# Patient Record
Sex: Female | Born: 2000 | Race: White | Hispanic: No | Marital: Single | Smoking: Current some day smoker
Health system: Southern US, Community
[De-identification: ages and names within clinical notes are randomized; demographics above are authoritative.]

## PROBLEM LIST (undated history)

## (undated) DIAGNOSIS — F909 Attention-deficit hyperactivity disorder, unspecified type: Secondary | ICD-10-CM

## (undated) HISTORY — PX: URETER SURGERY: SHX823

---
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
